# Patient Record
Sex: Female | Born: 1955 | Race: White | Hispanic: No | Marital: Married | Smoking: Never smoker
Health system: Southern US, Community
[De-identification: ages and names within clinical notes are randomized; demographics above are authoritative.]

## PROBLEM LIST (undated history)

## (undated) DIAGNOSIS — M1990 Unspecified osteoarthritis, unspecified site: Secondary | ICD-10-CM

## (undated) DIAGNOSIS — E079 Disorder of thyroid, unspecified: Secondary | ICD-10-CM

## (undated) HISTORY — PX: TUBAL LIGATION: SHX77

## (undated) HISTORY — PX: TONSILLECTOMY: SUR1361

## (undated) HISTORY — DX: Disorder of thyroid, unspecified: E07.9

## (undated) HISTORY — PX: HERNIA REPAIR: SHX51

## (undated) HISTORY — DX: Unspecified osteoarthritis, unspecified site: M19.90

---

## 1998-06-19 ENCOUNTER — Ambulatory Visit (HOSPITAL_COMMUNITY): Admission: RE | Admit: 1998-06-19 | Discharge: 1998-06-19 | Payer: Self-pay | Admitting: *Deleted

## 1998-07-20 ENCOUNTER — Ambulatory Visit (HOSPITAL_COMMUNITY): Admission: RE | Admit: 1998-07-20 | Discharge: 1998-07-20 | Payer: Self-pay | Admitting: Orthopedic Surgery

## 2000-02-18 ENCOUNTER — Other Ambulatory Visit: Admission: RE | Admit: 2000-02-18 | Discharge: 2000-02-18 | Payer: Self-pay | Admitting: Obstetrics & Gynecology

## 2002-08-30 ENCOUNTER — Other Ambulatory Visit: Admission: RE | Admit: 2002-08-30 | Discharge: 2002-08-30 | Payer: Self-pay | Admitting: Obstetrics & Gynecology

## 2002-09-27 ENCOUNTER — Other Ambulatory Visit: Admission: RE | Admit: 2002-09-27 | Discharge: 2002-09-27 | Payer: Self-pay | Admitting: Obstetrics and Gynecology

## 2003-06-05 ENCOUNTER — Other Ambulatory Visit: Admission: RE | Admit: 2003-06-05 | Discharge: 2003-06-05 | Payer: Self-pay | Admitting: Obstetrics and Gynecology

## 2003-11-07 ENCOUNTER — Other Ambulatory Visit: Admission: RE | Admit: 2003-11-07 | Discharge: 2003-11-07 | Payer: Self-pay | Admitting: Obstetrics and Gynecology

## 2006-10-10 ENCOUNTER — Encounter: Admission: RE | Admit: 2006-10-10 | Discharge: 2006-10-10 | Payer: Self-pay | Admitting: Neurology

## 2010-12-15 ENCOUNTER — Encounter: Payer: Self-pay | Admitting: Neurology

## 2011-02-06 ENCOUNTER — Other Ambulatory Visit (HOSPITAL_COMMUNITY): Payer: Self-pay | Admitting: Rheumatology

## 2011-02-06 DIAGNOSIS — M659 Synovitis and tenosynovitis, unspecified: Secondary | ICD-10-CM

## 2011-02-06 DIAGNOSIS — M79643 Pain in unspecified hand: Secondary | ICD-10-CM

## 2011-02-06 DIAGNOSIS — M65949 Unspecified synovitis and tenosynovitis, unspecified hand: Secondary | ICD-10-CM

## 2011-02-12 ENCOUNTER — Ambulatory Visit (HOSPITAL_COMMUNITY)
Admission: RE | Admit: 2011-02-12 | Discharge: 2011-02-12 | Disposition: A | Discharge status: Home / Self Care | Payer: BC Managed Care – PPO | Source: Ambulatory Visit | Attending: Rheumatology | Admitting: Rheumatology

## 2011-02-12 DIAGNOSIS — M25649 Stiffness of unspecified hand, not elsewhere classified: Secondary | ICD-10-CM | POA: Insufficient documentation

## 2011-02-12 DIAGNOSIS — M79643 Pain in unspecified hand: Secondary | ICD-10-CM

## 2011-02-12 DIAGNOSIS — M659 Synovitis and tenosynovitis, unspecified: Secondary | ICD-10-CM

## 2011-02-12 DIAGNOSIS — M25549 Pain in joints of unspecified hand: Secondary | ICD-10-CM | POA: Insufficient documentation

## 2011-02-12 DIAGNOSIS — M65949 Unspecified synovitis and tenosynovitis, unspecified hand: Secondary | ICD-10-CM

## 2013-02-03 ENCOUNTER — Telehealth: Payer: Self-pay | Admitting: Genetic Counselor

## 2013-02-03 NOTE — Telephone Encounter (Signed)
Pt will call back ones insurance is verified.Kimberly Ochoa

## 2018-03-02 ENCOUNTER — Other Ambulatory Visit: Payer: Self-pay | Admitting: Obstetrics and Gynecology

## 2018-03-02 DIAGNOSIS — R928 Other abnormal and inconclusive findings on diagnostic imaging of breast: Secondary | ICD-10-CM

## 2018-03-05 ENCOUNTER — Other Ambulatory Visit: Payer: Self-pay

## 2018-03-11 ENCOUNTER — Ambulatory Visit
Admission: RE | Admit: 2018-03-11 | Discharge: 2018-03-11 | Disposition: A | Discharge status: Home / Self Care | Payer: PRIVATE HEALTH INSURANCE | Source: Ambulatory Visit | Attending: Obstetrics and Gynecology | Admitting: Obstetrics and Gynecology

## 2018-03-11 DIAGNOSIS — R928 Other abnormal and inconclusive findings on diagnostic imaging of breast: Secondary | ICD-10-CM

## 2018-04-15 ENCOUNTER — Encounter: Payer: Self-pay | Admitting: Gastroenterology

## 2018-05-07 ENCOUNTER — Other Ambulatory Visit: Payer: Self-pay | Admitting: General Surgery

## 2018-05-07 DIAGNOSIS — Z1231 Encounter for screening mammogram for malignant neoplasm of breast: Secondary | ICD-10-CM

## 2018-06-01 ENCOUNTER — Ambulatory Visit (AMBULATORY_SURGERY_CENTER): Payer: Self-pay | Admitting: *Deleted

## 2018-06-01 ENCOUNTER — Other Ambulatory Visit: Payer: Self-pay

## 2018-06-01 VITALS — Ht 66.0 in | Wt 203.0 lb

## 2018-06-01 DIAGNOSIS — Z1211 Encounter for screening for malignant neoplasm of colon: Secondary | ICD-10-CM

## 2018-06-01 MED ORDER — PEG 3350-KCL-NA BICARB-NACL 420 G PO SOLR: 4000.0000 mL | Freq: Once | ORAL | 0 refills | Status: AC

## 2018-06-01 NOTE — Progress Notes (Signed)
No egg or soy allergy known to patient  No issues with past sedation with any surgeries  or procedures, no intubation problems  No diet pills per patient No home 02 use per patient  No blood thinners per patient  Pt denies issues with constipation  No A fib or A flutter  EMMI video sent to pt's e mail  

## 2018-06-22 ENCOUNTER — Encounter: Payer: Self-pay | Admitting: Gastroenterology

## 2018-06-22 ENCOUNTER — Ambulatory Visit (AMBULATORY_SURGERY_CENTER): Payer: PRIVATE HEALTH INSURANCE | Admitting: Gastroenterology

## 2018-06-22 VITALS — BP 118/85 | HR 71 | Temp 99.3°F | Resp 13 | Ht 66.0 in | Wt 203.0 lb

## 2018-06-22 DIAGNOSIS — D125 Benign neoplasm of sigmoid colon: Secondary | ICD-10-CM | POA: Diagnosis not present

## 2018-06-22 DIAGNOSIS — Z1211 Encounter for screening for malignant neoplasm of colon: Secondary | ICD-10-CM

## 2018-06-22 DIAGNOSIS — K635 Polyp of colon: Secondary | ICD-10-CM

## 2018-06-22 DIAGNOSIS — K5140 Inflammatory polyps of colon without complications: Secondary | ICD-10-CM | POA: Diagnosis not present

## 2018-06-22 MED ORDER — SODIUM CHLORIDE 0.9 % IV SOLN: 500.0000 mL | Freq: Once | INTRAVENOUS | Status: AC

## 2018-06-22 NOTE — Progress Notes (Signed)
Report given to PACU, vss 

## 2018-06-22 NOTE — Op Note (Signed)
[place] Patient Name: Kimberly Ochoa Procedure Date: 06/22/2018 2:30 PM MRN: [id number] Endoscopist: Milus Banister , MD Age: 62 Referring MD:  Date of Birth: [birth year] Gender: Female Account #: [id number] Procedure:                Colonoscopy Indications:              Screening for colorectal malignant neoplasm Medicines:                Monitored Anesthesia Care Procedure:                Pre-Anesthesia Assessment:                           - Prior to the procedure, a History and Physical                            was performed, and patient medications and                            allergies were reviewed. The patient's tolerance of                            previous anesthesia was also reviewed. The risks                            and benefits of the procedure and the sedation                            options and risks were discussed with the patient.                            All questions were answered, and informed consent                            was obtained. Prior Anticoagulants: The patient has                            taken no previous anticoagulant or antiplatelet                            agents. ASA Grade Assessment: II - A patient with                            mild systemic disease. After reviewing the risks                            and benefits, the patient was deemed in                            satisfactory condition to undergo the procedure.                           After obtaining informed consent, the colonoscope  was passed under direct vision. Throughout the                            procedure, the patient's blood pressure, pulse, and                            oxygen saturations were monitored continuously. The                            Colonoscope was introduced through the anus and                            advanced to the the cecum, identified by                            appendiceal orifice and  ileocecal valve. The                            colonoscopy was performed without difficulty. The                            patient tolerated the procedure well. The quality                            of the bowel preparation was good. The ileocecal                            valve, appendiceal orifice, and rectum were                            photographed. Scope In: 2:38:26 PM Scope Out: 2:52:54 PM Scope Withdrawal Time: 0 hours 6 minutes 56 seconds  Total Procedure Duration: 0 hours 14 minutes 28 seconds  Findings:                 A 13 mm polyp was found in the sigmoid colon. The                            polyp was pedunculated. The polyp was removed with                            a hot snare. Resection and retrieval were complete.                           The exam was otherwise without abnormality on                            direct and retroflexion views. Complications:            No immediate complications. Estimated blood loss:                            None. Estimated Blood Loss:     Estimated blood loss: none. Impression:               -  One 13 mm polyp in the sigmoid colon, removed                            with a hot snare. Resected and retrieved.                           - The examination was otherwise normal on direct                            and retroflexion views. Recommendation:           - Patient has a contact number available for                            emergencies. The signs and symptoms of potential                            delayed complications were discussed with the                            patient. Return to normal activities tomorrow.                            Written discharge instructions were provided to the                            patient.                           - Resume previous diet.                           - Continue present medications.                           You will receive a letter within 2-3 weeks with the                             pathology results and my final recommendations.                           If the polyp(s) is proven to be 'pre-cancerous' on                            pathology, you will need repeat colonoscopy in 3                            years. If the polyp(s) is NOT 'precancerous' on                            pathology then you should repeat colon cancer                            screening in 10 years with colonoscopy without need  for colon cancer screening by any method prior to                            then (including stool testing).                           Dr. Ardis Hughs' office will request genetic testing                            results from Dr. Helane Rima as well. Milus Banister, MD 06/22/2018 2:55:25 PM This report has been signed electronically.

## 2018-06-22 NOTE — Progress Notes (Signed)
Called to room to assist during endoscopic procedure.  Patient ID and intended procedure confirmed with present staff. Received instructions for my participation in the procedure from the performing physician.  

## 2018-06-22 NOTE — Progress Notes (Signed)
Pt's states no medical or surgical changes since previsit or office visit. 

## 2018-06-22 NOTE — Patient Instructions (Signed)
YOU HAD AN ENDOSCOPIC PROCEDURE TODAY AT THE [facility]:   Refer to the procedure report that was given to you for any specific questions about what was found during the examination.  If the procedure report does not answer your questions, please call your gastroenterologist to clarify.  If you requested that your care partner not be given the details of your procedure findings, then the procedure report has been included in a sealed envelope for you to review at your convenience later.  YOU SHOULD EXPECT: Some feelings of bloating in the abdomen. Passage of more gas than usual.  Walking can help get rid of the air that was put into your GI tract during the procedure and reduce the bloating. If you had a lower endoscopy (such as a colonoscopy or flexible sigmoidoscopy) you may notice spotting of blood in your stool or on the toilet paper. If you underwent a bowel prep for your procedure, you may not have a normal bowel movement for a few days.  Please Note:  You might notice some irritation and congestion in your nose or some drainage.  This is from the oxygen used during your procedure.  There is no need for concern and it should clear up in a day or so.  SYMPTOMS TO REPORT IMMEDIATELY:   Following lower endoscopy (colonoscopy or flexible sigmoidoscopy):  Excessive amounts of blood in the stool  Significant tenderness or worsening of abdominal pains  Swelling of the abdomen that is new, acute  Fever of 100F or higher   For urgent or emergent issues, a gastroenterologist can be reached at any hour by calling [phone].   DIET:  We do recommend a small meal at first, but then you may proceed to your regular diet.  Drink plenty of fluids but you should avoid alcoholic beverages for 24 hours.  ACTIVITY:  You should plan to take it easy for the rest of today and you should NOT DRIVE or use heavy machinery until tomorrow (because of the sedation medicines used during the test).     FOLLOW UP: Our staff will call the number listed on your records the next business day following your procedure to check on you and address any questions or concerns that you may have regarding the information given to you following your procedure. If we do not reach you, we will leave a message.  However, if you are feeling well and you are not experiencing any problems, there is no need to return our call.  We will assume that you have returned to your regular daily activities without incident.  If any biopsies were taken you will be contacted by phone or by letter within the next 1-3 weeks.  Please call us at [phone] if you have not heard about the biopsies in 3 weeks.    SIGNATURES/CONFIDENTIALITY: You and/or your care partner have signed paperwork which will be entered into your electronic medical record.  These signatures attest to the fact that that the information above on your After Visit Summary has been reviewed and is understood.  Full responsibility of the confidentiality of this discharge information lies with you and/or your care-partner.  Read all of the handouts given to you by your recovery room nurse. 

## 2018-06-23 ENCOUNTER — Telehealth: Payer: Self-pay | Admitting: *Deleted

## 2018-06-23 ENCOUNTER — Telehealth: Payer: Self-pay

## 2018-06-23 NOTE — Telephone Encounter (Signed)
First follow up call attempt.  Voicemail with phone number identifier.  Message left to call if any questions or concerns. 

## 2018-06-23 NOTE — Telephone Encounter (Signed)
  Follow up Call-  Call back number 06/22/2018  Post procedure Call Back phone  # [phone]  Permission to leave phone message Yes  Some recent data might be hidden     Patient questions:  Do you have a fever, pain , or abdominal swelling? No. Pain Score  0 *  Have you tolerated food without any problems? Yes.    Have you been able to return to your normal activities? Yes.    Do you have any questions about your discharge instructions: Diet   No. Medications  No. Follow up visit  No.  Do you have questions or concerns about your Care? No.  Actions: * If pain score is 4 or above: No action needed, pain <4.

## 2018-07-30 ENCOUNTER — Ambulatory Visit
Admission: RE | Admit: 2018-07-30 | Discharge: 2018-07-30 | Disposition: A | Discharge status: Home / Self Care | Payer: PRIVATE HEALTH INSURANCE | Source: Ambulatory Visit | Attending: General Surgery | Admitting: General Surgery

## 2018-07-30 DIAGNOSIS — Z1231 Encounter for screening mammogram for malignant neoplasm of breast: Secondary | ICD-10-CM

## 2018-07-30 MED ORDER — GADOBENATE DIMEGLUMINE 529 MG/ML IV SOLN
20.0000 mL | Freq: Once | INTRAVENOUS | Status: AC | PRN
  Administered 2018-07-30: 20 mL via INTRAVENOUS

## 2018-08-02 ENCOUNTER — Other Ambulatory Visit: Payer: Self-pay | Admitting: General Surgery

## 2018-08-02 DIAGNOSIS — R9389 Abnormal findings on diagnostic imaging of other specified body structures: Secondary | ICD-10-CM

## 2018-08-13 ENCOUNTER — Ambulatory Visit
Admission: RE | Admit: 2018-08-13 | Discharge: 2018-08-13 | Disposition: A | Discharge status: Home / Self Care | Payer: PRIVATE HEALTH INSURANCE | Source: Ambulatory Visit | Attending: General Surgery | Admitting: General Surgery

## 2018-08-13 ENCOUNTER — Ambulatory Visit: Payer: PRIVATE HEALTH INSURANCE

## 2018-08-13 ENCOUNTER — Other Ambulatory Visit: Payer: Self-pay | Admitting: General Surgery

## 2018-08-13 DIAGNOSIS — R9389 Abnormal findings on diagnostic imaging of other specified body structures: Secondary | ICD-10-CM

## 2018-08-13 MED ORDER — GADOBENATE DIMEGLUMINE 529 MG/ML IV SOLN
20.0000 mL | Freq: Once | INTRAVENOUS | Status: AC | PRN
  Administered 2018-08-13: 20 mL via INTRAVENOUS

## 2019-01-06 ENCOUNTER — Other Ambulatory Visit: Payer: Self-pay | Admitting: Physician Assistant

## 2019-03-24 ENCOUNTER — Other Ambulatory Visit: Payer: Self-pay | Admitting: Obstetrics and Gynecology

## 2019-03-24 DIAGNOSIS — R928 Other abnormal and inconclusive findings on diagnostic imaging of breast: Secondary | ICD-10-CM

## 2019-04-12 ENCOUNTER — Other Ambulatory Visit: Payer: PRIVATE HEALTH INSURANCE

## 2019-05-02 ENCOUNTER — Other Ambulatory Visit: Payer: PRIVATE HEALTH INSURANCE

## 2019-09-13 ENCOUNTER — Other Ambulatory Visit: Payer: Self-pay | Admitting: General Surgery

## 2019-09-13 DIAGNOSIS — Z803 Family history of malignant neoplasm of breast: Secondary | ICD-10-CM

## 2019-09-29 ENCOUNTER — Other Ambulatory Visit: Payer: PRIVATE HEALTH INSURANCE

## 2019-10-13 ENCOUNTER — Other Ambulatory Visit: Payer: Self-pay

## 2019-10-13 ENCOUNTER — Ambulatory Visit
Admission: RE | Admit: 2019-10-13 | Discharge: 2019-10-13 | Disposition: A | Discharge status: Home / Self Care | Payer: Self-pay | Source: Ambulatory Visit | Attending: General Surgery | Admitting: General Surgery

## 2019-10-13 DIAGNOSIS — Z803 Family history of malignant neoplasm of breast: Secondary | ICD-10-CM

## 2019-10-13 MED ORDER — GADOBUTROL 1 MMOL/ML IV SOLN
10.0000 mL | Freq: Once | INTRAVENOUS | Status: AC | PRN
  Administered 2019-10-13: 10 mL via INTRAVENOUS

## 2020-03-13 ENCOUNTER — Other Ambulatory Visit: Payer: Self-pay | Admitting: General Surgery

## 2020-03-13 DIAGNOSIS — Z9189 Other specified personal risk factors, not elsewhere classified: Secondary | ICD-10-CM

## 2020-05-11 ENCOUNTER — Other Ambulatory Visit: Payer: Self-pay

## 2020-05-11 ENCOUNTER — Ambulatory Visit
Admission: RE | Admit: 2020-05-11 | Discharge: 2020-05-11 | Disposition: A | Discharge status: Home / Self Care | Payer: No Typology Code available for payment source | Source: Ambulatory Visit | Attending: General Surgery | Admitting: General Surgery

## 2020-05-11 DIAGNOSIS — Z9189 Other specified personal risk factors, not elsewhere classified: Secondary | ICD-10-CM

## 2020-05-11 MED ORDER — GADOBUTROL 1 MMOL/ML IV SOLN
9.0000 mL | Freq: Once | INTRAVENOUS | Status: AC | PRN
  Administered 2020-05-11: 9 mL via INTRAVENOUS

## 2020-10-12 ENCOUNTER — Other Ambulatory Visit: Payer: Self-pay | Admitting: General Surgery

## 2020-10-12 DIAGNOSIS — Z1501 Genetic susceptibility to malignant neoplasm of breast: Secondary | ICD-10-CM

## 2021-07-25 IMAGING — MR MR BREAST BILAT WO/W CM
8 of 12 series · 30 of 48 positions shown · IV contrast (10 ml Gadavist)
Comparison: Previous exam(s).
COMPARISON: Previous exam(s).

Addendum:
CLINICAL DATA: High risk screening MRI for family history. The
patient's mother had history of breast cancer at age 27 and again at
age 63. Additionally, the patient was scheduled for an MRI biopsy in
Wednesday July, 2018 for non mass enhancement in the retroareolar right
breast. The abnormality did not persist at the time of biopsy, and
it was canceled. She has been lost to follow-up for this finding in
till this time.

LABS:  None.
EXAM:
BILATERAL BREAST MRI WITH AND WITHOUT CONTRAST
TECHNIQUE: Multiplanar, multisequence MR images of both breasts were obtained
prior to and following the intravenous administration of 10 ml of
Gadavist

[Series 2: t2_tirm_tra ipat (a-p) · axial · 3.0mm · 0.70mm/px · 1 of 55 slices shown]
[im 1/55]
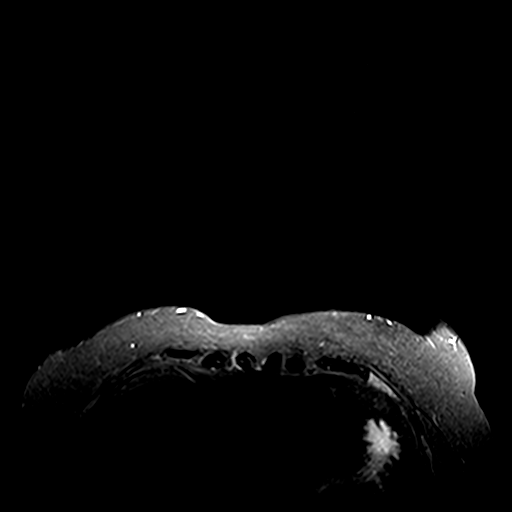

[Series 3: fl3d pre-cm no · axial · non-contrast · 1.2mm · 0.94mm/px · z∈[-55,+116]mm · 5 of 144 slices shown]
[im 1/144]
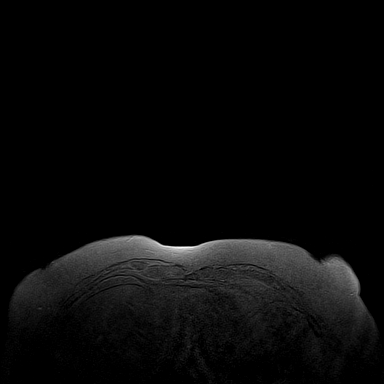
[im 36/144]
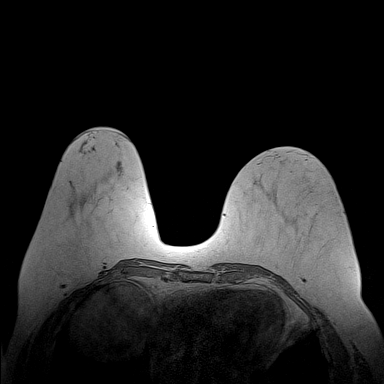
[im 72/144]
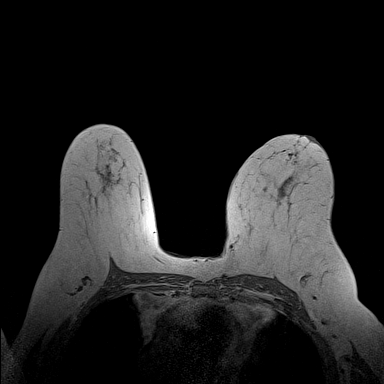
[im 108/144]
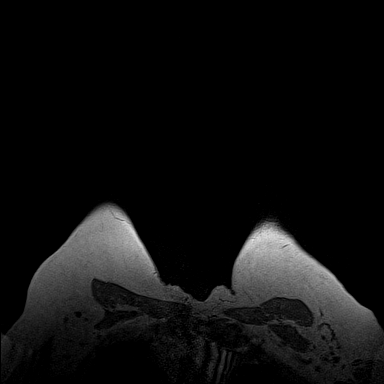
[im 144/144]
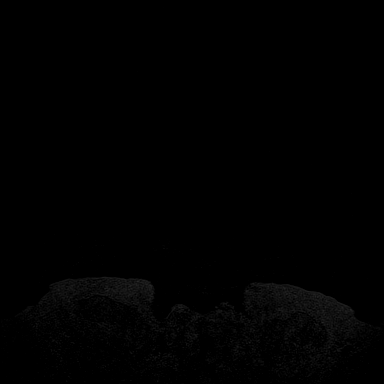

[Series 4: fl3d pre-cm · axial · non-contrast · 1.2mm · 0.94mm/px · z∈[-55,+116]mm · 5 of 144 slices shown]
[im 1/144]
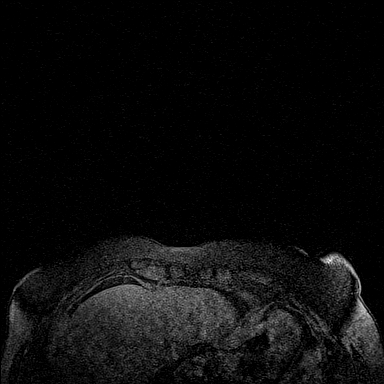
[im 36/144]
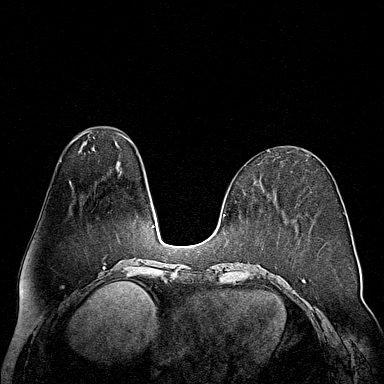
[im 72/144]
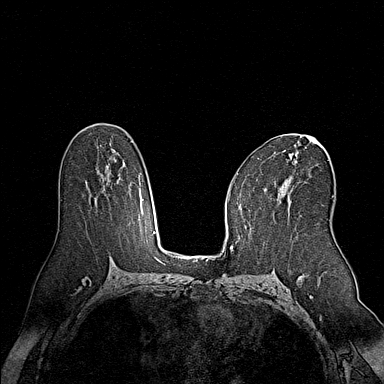
[im 108/144]
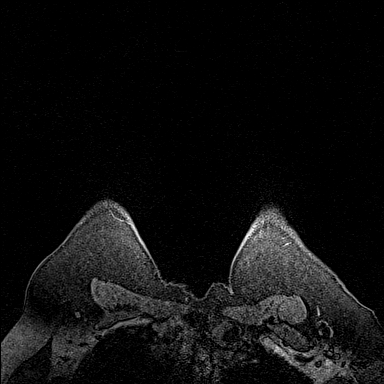
[im 144/144]
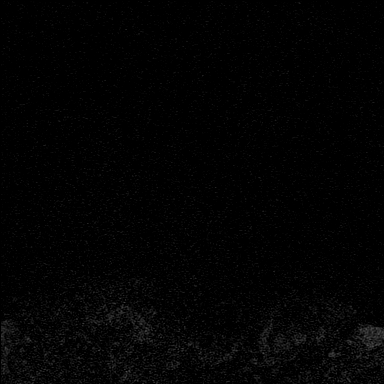

[Series 5: fl3d post-cm 20 · axial · 1.2mm · 0.94mm/px · z∈[-55,+116]mm · 5 of 144 slices shown (1 of 3)]
[im 1/144]
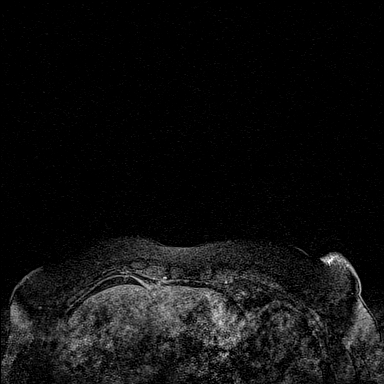
[im 36/144]
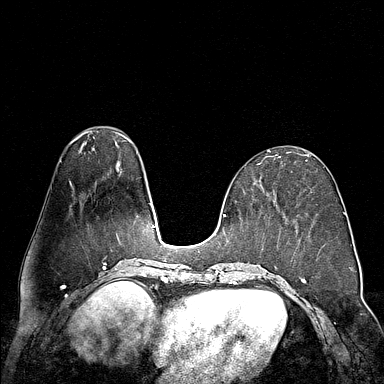
[im 72/144]
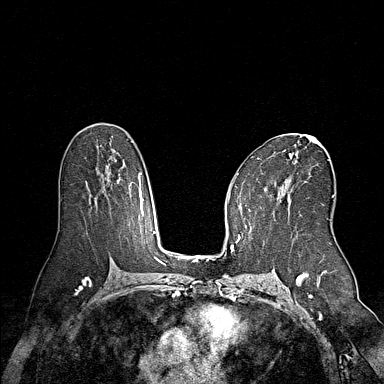
[im 108/144]
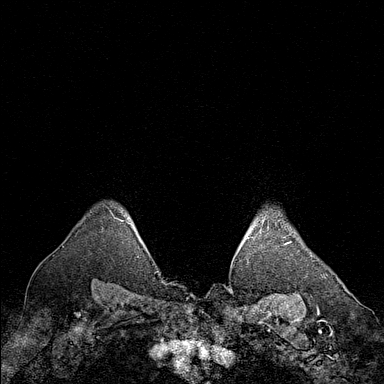
[im 144/144]
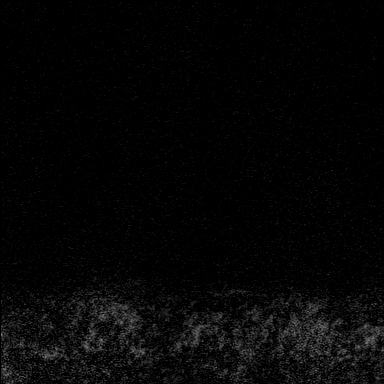

[Series 6: fl3d post-cm 20 · axial · 1.2mm · 0.94mm/px · z∈[-55,+116]mm · 5 of 144 slices shown (2 of 3)]
[im 1/144]
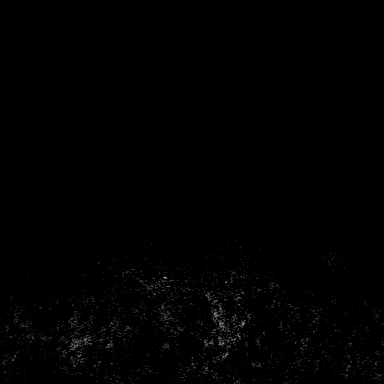
[im 36/144]
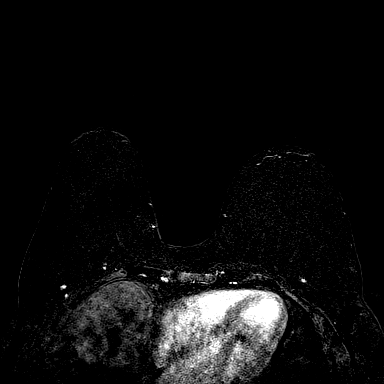
[im 72/144]
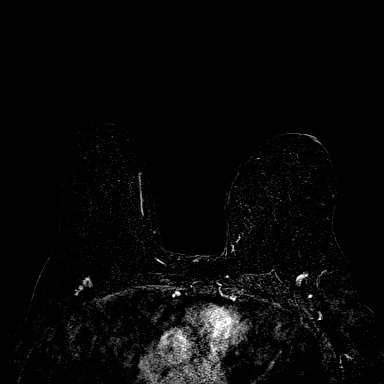
[im 108/144]
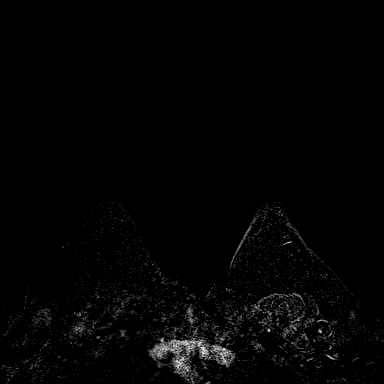
[im 144/144]
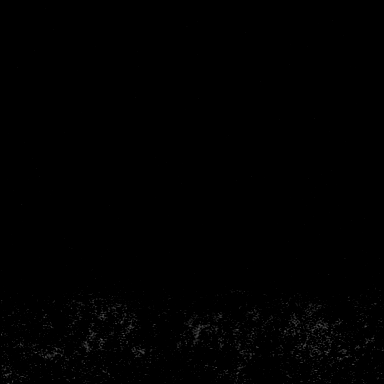

[Series 7: fl3d post-cm 20 · axial · 172.8mm · 0.94mm/px · 1 of 1 slices shown (3 of 3)]
[im 1/1]
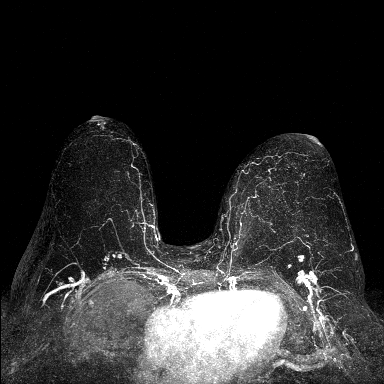

[Series 8: fl3d post-cm 3min · axial · 1.2mm · 0.94mm/px · z∈[-55,+116]mm · 6 of 144 slices shown]
[im 1/144]
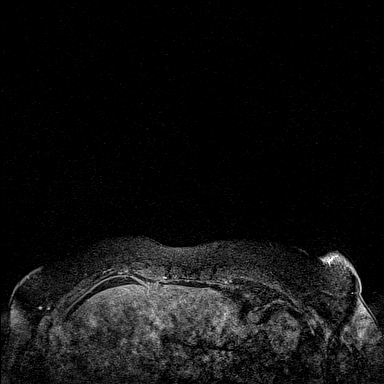
[im 29/144]
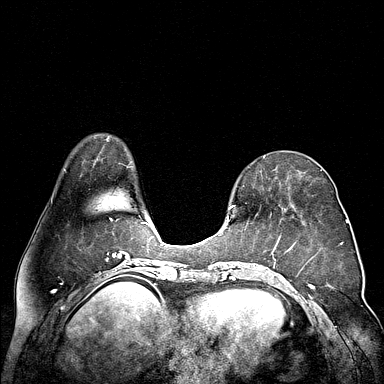
[im 58/144]
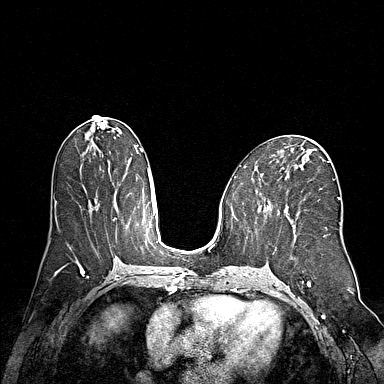
[im 86/144]
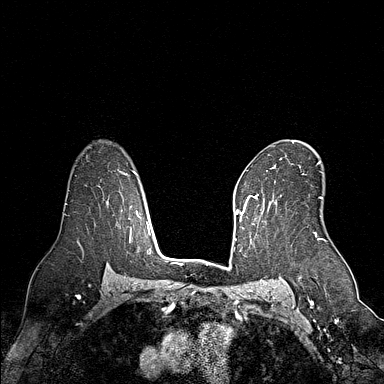
[im 115/144]
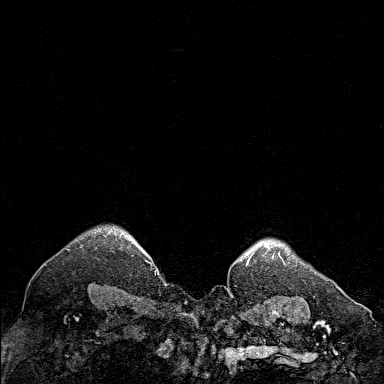
[im 144/144]
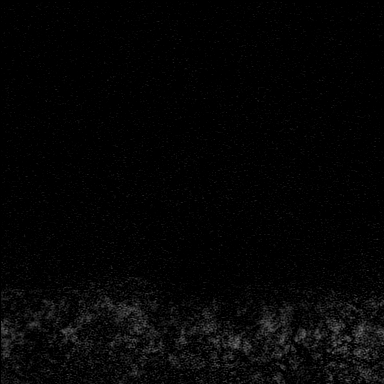

[Series 9: fl3d post-cm 3min_sub · axial · 1.2mm · 0.94mm/px · z∈[-55,-22]mm · 2 of 144 slices shown]
[im 1/144]
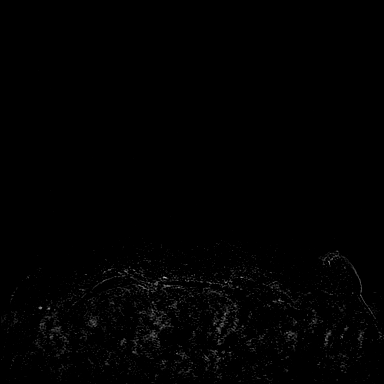
[im 29/144]
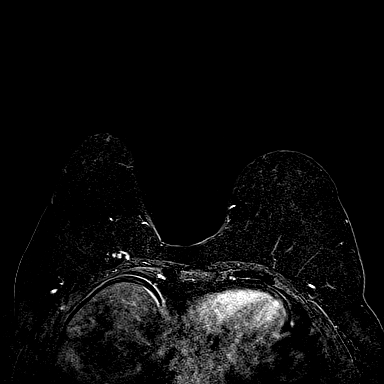

[30 of 48 positions shown; findings below may reference images not displayed]

Three-dimensional MR images were rendered by post-processing of the
original MR data on an independent workstation. The
three-dimensional MR images were interpreted, and findings are
reported in the following complete MRI report for this study. Three
dimensional images were evaluated at the independent [employer]
workstation
FINDINGS: Breast composition: b. Scattered fibroglandular tissue.

Background parenchymal enhancement: Minimal.

Right breast: There is an area of non mass enhancement in the
retroareolar left breast immediately deep to the nipple measuring
approximately 1.0 cm (series 6, image 89). This is similar in
appearance to the MRI from 07/30/2018.

Left breast: No mass or abnormal enhancement.

Lymph nodes: No abnormal appearing lymph nodes.

Ancillary findings:  None.
IMPRESSION: 1. There is a 1.0 cm area of non mass enhancement in the immediate
retroareolar right breast, similar to the prior exam. An attempt at
MRI guided biopsy was made on 08/13/2018, however the abnormality
did not persist at the time of biopsy.

2.  No MRI evidence of left breast malignancy.

RECOMMENDATION:
Surgical consultation recommended for the non mass enhancement in
the retroareolar right breast.

A message through [REDACTED] was sent to Dr. Quirijn regarding the
findings and recommendations.

BI-RADS CATEGORY  4: Suspicious.

ADDENDUM:
Correction to the findings section of the right breast. The non mass
enhancement described in the retroareolar breast is in the RIGHT
breast. The left breast is normal.

*** End of Addendum ***
Three-dimensional MR images were rendered by post-processing of the
original MR data on an independent workstation. The
three-dimensional MR images were interpreted, and findings are
reported in the following complete MRI report for this study. Three
dimensional images were evaluated at the independent [employer]
workstation
FINDINGS: Breast composition: b. Scattered fibroglandular tissue.

Background parenchymal enhancement: Minimal.

Right breast: There is an area of non mass enhancement in the
retroareolar left breast immediately deep to the nipple measuring
approximately 1.0 cm (series 6, image 89). This is similar in
appearance to the MRI from 07/30/2018.

Left breast: No mass or abnormal enhancement.

Lymph nodes: No abnormal appearing lymph nodes.

Ancillary findings:  None.
IMPRESSION: 1. There is a 1.0 cm area of non mass enhancement in the immediate
retroareolar right breast, similar to the prior exam. An attempt at
MRI guided biopsy was made on 08/13/2018, however the abnormality
did not persist at the time of biopsy.

2.  No MRI evidence of left breast malignancy.

RECOMMENDATION:
Surgical consultation recommended for the non mass enhancement in
the retroareolar right breast.

A message through [REDACTED] was sent to Dr. Quirijn regarding the
findings and recommendations.

BI-RADS CATEGORY  4: Suspicious.

## 2022-10-07 ENCOUNTER — Other Ambulatory Visit: Payer: Self-pay | Admitting: *Deleted

## 2022-10-07 DIAGNOSIS — E063 Autoimmune thyroiditis: Secondary | ICD-10-CM

## 2022-10-14 ENCOUNTER — Ambulatory Visit
Admission: RE | Admit: 2022-10-14 | Discharge: 2022-10-14 | Disposition: A | Discharge status: Home / Self Care | Payer: Medicare Other | Source: Ambulatory Visit | Attending: *Deleted | Admitting: *Deleted

## 2022-10-14 DIAGNOSIS — E063 Autoimmune thyroiditis: Secondary | ICD-10-CM
